# Patient Record
Sex: Female | Born: 1990 | Race: Black or African American | Hispanic: No | Marital: Single | Smoking: Former smoker
Health system: Southern US, Community
[De-identification: ages and names within clinical notes are randomized; demographics above are authoritative.]

---

## 2013-08-13 ENCOUNTER — Emergency Department (HOSPITAL_COMMUNITY)
Admission: EM | Admit: 2013-08-13 | Discharge: 2013-08-13 | Disposition: A | Discharge status: Home / Self Care | Payer: 59 | Source: Home / Self Care | Attending: Emergency Medicine | Admitting: Emergency Medicine

## 2013-08-13 ENCOUNTER — Encounter (HOSPITAL_COMMUNITY): Payer: Self-pay | Admitting: Emergency Medicine

## 2013-08-13 DIAGNOSIS — N949 Unspecified condition associated with female genital organs and menstrual cycle: Secondary | ICD-10-CM

## 2013-08-13 DIAGNOSIS — R102 Pelvic and perineal pain: Secondary | ICD-10-CM

## 2013-08-13 LAB — POCT PREGNANCY, URINE: PREG TEST UR: NEGATIVE

## 2013-08-13 LAB — POCT URINALYSIS DIP (DEVICE)
Bilirubin Urine: NEGATIVE
Glucose, UA: NEGATIVE mg/dL
Hgb urine dipstick: NEGATIVE
Ketones, ur: 15 mg/dL — AB
Leukocytes, UA: NEGATIVE
NITRITE: NEGATIVE
PROTEIN: NEGATIVE mg/dL
UROBILINOGEN UA: 0.2 mg/dL (ref 0.0–1.0)
pH: 6.5 (ref 5.0–8.0)

## 2013-08-13 NOTE — ED Provider Notes (Signed)
Medical screening examination/treatment/procedure(s) were performed by non-physician practitioner and as supervising physician I was immediately available for consultation/collaboration.  Vi Whitesel, M.D.   ReLeslee Homeuben Likesavid C Jadis Pitter, MD 08/13/13 [phone]

## 2013-08-13 NOTE — ED Notes (Signed)
Pt  Reports  Symptoms  Of  abd  Pain  With  Cramping          For  About 1  Week          She  denys  Any  Nausea       Vomiting  Or  Diarrhea        She     denys  Any  Vaginal bleeding  Or  Any  Discharge      -  She  Is  Sitting  Upright on exam table  Speaking in  Complete  sentances

## 2013-08-13 NOTE — ED Provider Notes (Signed)
CSN: [id number]     Arrival date & time 08/13/13  1444 History   First MD Initiated Contact with Patient 08/13/13 1659     Chief Complaint  Patient presents with  . Abdominal Cramping   (Consider location/radiation/quality/duration/timing/severity/associated sxs/prior Treatment) HPI Comments: Reports pelvic cramping with 1-2 days of vaginal spotting on first two days of symptoms and associated mild nausea. Was concerned that she might be pregnant. No dysuria, hematuria, flank pain, vaginal discharge, blood per rectum, melena, vomiting or diarrhea, fever. Reports mild to moderate constipation.  LNMP: 3/3-07/28/2013  Patient is a 23 y.o. female presenting with cramps. The history is provided by the patient.  Abdominal Cramping This is a new problem. Episode onset: 1 week ago. Episode frequency: occasional. The problem has been gradually improving. Nothing aggravates the symptoms. Nothing relieves the symptoms.    History reviewed. No pertinent past medical history. History reviewed. No pertinent past surgical history. History reviewed. No pertinent family history. History  Substance Use Topics  . Smoking status: Former Games developer  . Smokeless tobacco: Not on file  . Alcohol Use: No   OB History   Grav Para Term Preterm Abortions TAB SAB Ect Mult Living                 Review of Systems  All other systems reviewed and are negative.    Allergies  Review of patient's allergies indicates no known allergies.  Home Medications   Current Outpatient Rx  Name  Route  Sig  Dispense  Refill  . Ibuprofen (MOTRIN PO)   Oral   Take by mouth.          BP 139/92  Pulse 69  Temp(Src) 99.2 F (37.3 C) (Oral)  Resp 16  SpO2 100%  LMP 07/28/2013 Physical Exam  Nursing note and vitals reviewed. Constitutional: She is oriented to person, place, and time. She appears well-developed and well-nourished. No distress.  HENT:  Head: Normocephalic and atraumatic.  Right Ear: External ear  normal.  Left Ear: External ear normal.  Nose: Nose normal.  Eyes: Conjunctivae are normal. No scleral icterus.  Neck: Normal range of motion. Neck supple.  Cardiovascular: Normal rate, regular rhythm and normal heart sounds.   Pulmonary/Chest: Effort normal and breath sounds normal.  Abdominal: Soft. Normal appearance and bowel sounds are normal. She exhibits no distension and no mass. There is no hepatosplenomegaly. There is no tenderness. There is no rigidity, no rebound, no guarding, no CVA tenderness, no tenderness at McBurney's point and negative Murphy's sign. Hernia confirmed negative in the ventral area, confirmed negative in the right inguinal area and confirmed negative in the left inguinal area.  Musculoskeletal: Normal range of motion.  Neurological: She is alert and oriented to person, place, and time.  Skin: Skin is warm and dry. No rash noted. No erythema.  Psychiatric: She has a normal mood and affect. Her behavior is normal.    ED Course  Procedures (including critical care time) Labs Review Labs Reviewed  POCT URINALYSIS DIP (DEVICE) - Abnormal; Notable for the following:    Ketones, ur 15 (*)    All other components within normal limits  POCT PREGNANCY, URINE   Imaging Review No results found.   MDM   1. Pelvic cramping    Advised patient to monitor her symptoms closely and to retest for pregnancy at home if she misses her menstrual cycle that is due at the beginning of April. Advised that if symptoms become suddenly worse, persistent or severe,  she should seek re-evaluation at her nearest ER. Tylenol or ibuprofen as directed on packaging. Increase water intact as UA suggests mild dehydration and this will contribute to constipation.   Jess BartersJennifer Lee BelfieldPresson, GeorgiaPA 08/13/13 1736

## 2013-08-13 NOTE — Discharge Instructions (Signed)
monitor your symptoms closely at home and retest for pregnancy at home if you miss your menstrual cycle that is due at the beginning of April. Advised that if symptoms become suddenly worse, persistent or severe, seek re-evaluation at nearest ER. Tylenol or ibuprofen as directed on packaging. Increase water intact as UA suggests mild dehydration and this will contribute to constipation.  Pelvic Pain, Female Female pelvic pain can be caused by many different things and start from a variety of places. Pelvic pain refers to pain that is located in the lower half of the abdomen and between your hips. The pain may occur over a short period of time (acute) or may be reoccurring (chronic). The cause of pelvic pain may be related to disorders affecting the female reproductive organs (gynecologic), but it may also be related to the bladder, kidney stones, an intestinal complication, or muscle or skeletal problems. Getting help right away for pelvic pain is important, especially if there has been severe, sharp, or a sudden onset of unusual pain. It is also important to get help right away because some types of pelvic pain can be life threatening.  CAUSES  Below are only some of the causes of pelvic pain. The causes of pelvic pain can be in one of several categories.   Gynecologic.  Pelvic inflammatory disease.  Sexually transmitted infection.  Ovarian cyst or a twisted ovarian ligament (ovarian torsion).  Uterine lining that grows outside the uterus (endometriosis).  Fibroids, cysts, or tumors.  Ovulation.  Pregnancy.  Pregnancy that occurs outside the uterus (ectopic pregnancy).  Miscarriage.  Labor.  Abruption of the placenta or ruptured uterus.  Infection.  Uterine infection (endometritis).  Bladder infection.  Diverticulitis.  Miscarriage related to a uterine infection (septic abortion).  Bladder.  Inflammation of the bladder (cystitis).  Kidney  stone(s).  Gastrointenstinal.  Constipation.  Diverticulitis.  Neurologic.  Trauma.  Feeling pelvic pain because of mental or emotional causes (psychosomatic).  Cancers of the bowel or pelvis. EVALUATION  Your caregiver will want to take a careful history of your concerns. This includes recent changes in your health, a careful gynecologic history of your periods (menses), and a sexual history. Obtaining your family history and medical history is also important. Your caregiver may suggest a pelvic exam. A pelvic exam will help identify the location and severity of the pain. It also helps in the evaluation of which organ system may be involved. In order to identify the cause of the pelvic pain and be properly treated, your caregiver may order tests. These tests may include:   A pregnancy test.  Pelvic ultrasonography.  An X-ray exam of the abdomen.  A urinalysis or evaluation of vaginal discharge.  Blood tests. HOME CARE INSTRUCTIONS   Only take over-the-counter or prescription medicines for pain, discomfort, or fever as directed by your caregiver.   Rest as directed by your caregiver.   Eat a balanced diet.   Drink enough fluids to make your urine clear or pale yellow, or as directed.   Avoid sexual intercourse if it causes pain.   Apply warm or cold compresses to the lower abdomen depending on which one helps the pain.   Avoid stressful situations.   Keep a journal of your pelvic pain. Write down when it started, where the pain is located, and if there are things that seem to be associated with the pain, such as food or your menstrual cycle.  Follow up with your caregiver as directed.  SEEK MEDICAL CARE IF:  Your medicine does not help your pain.  You have abnormal vaginal discharge. SEEK IMMEDIATE MEDICAL CARE IF:   You have heavy bleeding from the vagina.   Your pelvic pain increases.   You feel lightheaded or faint.   You have chills.   You  have pain with urination or blood in your urine.   You have uncontrolled diarrhea or vomiting.   You have a fever or persistent symptoms for more than 3 days.  You have a fever and your symptoms suddenly get worse.   You are being physically or sexually abused.  MAKE SURE YOU:  Understand these instructions.  Will watch your condition.  Will get help if you are not doing well or get worse. Document Released: 04/05/2004 Document Revised: 11/08/2011 Document Reviewed: 08/29/2011 [facility] CenterExit[facility] Patient Information 2014 [place]ExitCare, MarylandLLC.

## 2013-08-13 NOTE — ED Notes (Signed)
Phone number verified for lab f/u.

## 2013-08-26 ENCOUNTER — Emergency Department (HOSPITAL_COMMUNITY)
Admission: EM | Admit: 2013-08-26 | Discharge: 2013-08-26 | Discharge status: Against Medical Advice | Payer: 59 | Source: Home / Self Care

## 2013-08-26 ENCOUNTER — Emergency Department (HOSPITAL_COMMUNITY)
Admission: EM | Admit: 2013-08-26 | Discharge: 2013-08-27 | Disposition: A | Discharge status: Home / Self Care | Payer: 59 | Attending: Emergency Medicine | Admitting: Emergency Medicine

## 2013-08-26 ENCOUNTER — Encounter (HOSPITAL_COMMUNITY): Payer: Self-pay | Admitting: Emergency Medicine

## 2013-08-26 DIAGNOSIS — R1013 Epigastric pain: Secondary | ICD-10-CM | POA: Insufficient documentation

## 2013-08-26 DIAGNOSIS — R0602 Shortness of breath: Secondary | ICD-10-CM | POA: Insufficient documentation

## 2013-08-26 DIAGNOSIS — Z87891 Personal history of nicotine dependence: Secondary | ICD-10-CM

## 2013-08-26 DIAGNOSIS — Z3202 Encounter for pregnancy test, result negative: Secondary | ICD-10-CM | POA: Insufficient documentation

## 2013-08-26 DIAGNOSIS — R109 Unspecified abdominal pain: Secondary | ICD-10-CM

## 2013-08-26 DIAGNOSIS — R1011 Right upper quadrant pain: Secondary | ICD-10-CM | POA: Insufficient documentation

## 2013-08-26 LAB — COMPREHENSIVE METABOLIC PANEL
ALK PHOS: 54 U/L (ref 39–117)
ALT: 12 U/L (ref 0–35)
AST: 14 U/L (ref 0–37)
Albumin: 3.9 g/dL (ref 3.5–5.2)
BUN: 11 mg/dL (ref 6–23)
CO2: 26 mEq/L (ref 19–32)
Calcium: 9.4 mg/dL (ref 8.4–10.5)
Chloride: 103 mEq/L (ref 96–112)
Creatinine, Ser: 0.72 mg/dL (ref 0.50–1.10)
GFR calc Af Amer: >90 mL/min (ref >90)
GLUCOSE: 88 mg/dL (ref 70–99)
Potassium: 3.6 mEq/L — ABNORMAL LOW (ref 3.7–5.3)
Sodium: 144 mEq/L (ref 137–147)
TOTAL PROTEIN: 7.5 g/dL (ref 6.0–8.3)
Total Bilirubin: 0.4 mg/dL (ref 0.3–1.2)

## 2013-08-26 LAB — CBC WITH DIFFERENTIAL/PLATELET
Basophils Absolute: 0 10*3/uL (ref 0.0–0.1)
Basophils Relative: 0 % (ref 0–1)
EOS ABS: 0 10*3/uL (ref 0.0–0.7)
Eosinophils Relative: 0 % (ref 0–5)
HEMATOCRIT: 38 % (ref 36.0–46.0)
Hemoglobin: 13 g/dL (ref 12.0–15.0)
LYMPHS ABS: 2.1 10*3/uL (ref 0.7–4.0)
Lymphocytes Relative: 16 % (ref 12–46)
MCH: 30.7 pg (ref 26.0–34.0)
MCHC: 34.2 g/dL (ref 30.0–36.0)
MCV: 89.8 fL (ref 78.0–100.0)
MONOS PCT: 11 % (ref 3–12)
Monocytes Absolute: 1.4 10*3/uL — ABNORMAL HIGH (ref 0.1–1.0)
NEUTROS ABS: 9.6 10*3/uL — AB (ref 1.7–7.7)
Neutrophils Relative %: 73 % (ref 43–77)
PLATELETS: 238 10*3/uL (ref 150–400)
RBC: 4.23 MIL/uL (ref 3.87–5.11)
RDW: 14 % (ref 11.5–15.5)
WBC: 13.2 10*3/uL — ABNORMAL HIGH (ref 4.0–10.5)

## 2013-08-26 LAB — URINALYSIS, ROUTINE W REFLEX MICROSCOPIC
Bilirubin Urine: NEGATIVE
Glucose, UA: NEGATIVE mg/dL
Hgb urine dipstick: NEGATIVE
KETONES UR: NEGATIVE mg/dL
NITRITE: NEGATIVE
PH: 6 (ref 5.0–8.0)
Protein, ur: NEGATIVE mg/dL
Specific Gravity, Urine: 1.029 (ref 1.005–1.030)
Urobilinogen, UA: 1 mg/dL (ref 0.0–1.0)

## 2013-08-26 LAB — URINE MICROSCOPIC-ADD ON

## 2013-08-26 LAB — LIPASE, BLOOD: LIPASE: 17 U/L (ref 11–59)

## 2013-08-26 LAB — PREGNANCY, URINE: Preg Test, Ur: NEGATIVE

## 2013-08-26 MED ORDER — SODIUM CHLORIDE 0.9 % IV SOLN
Freq: Once | INTRAVENOUS | Status: AC
  Administered 2013-08-26: 23:00:00 via INTRAVENOUS

## 2013-08-26 MED ORDER — FENTANYL CITRATE 0.05 MG/ML IJ SOLN: 100.0000 ug | Freq: Once | INTRAMUSCULAR | Status: DC

## 2013-08-26 MED ORDER — FENTANYL CITRATE 0.05 MG/ML IJ SOLN
50.0000 ug | Freq: Once | INTRAMUSCULAR | Status: AC
  Administered 2013-08-26: 50 ug via INTRAVENOUS
  Filled 2013-08-26: qty 2

## 2013-08-26 MED ORDER — GI COCKTAIL ~~LOC~~
30.0000 mL | Freq: Once | ORAL | Status: AC
  Administered 2013-08-26: 30 mL via ORAL
  Filled 2013-08-26: qty 30

## 2013-08-26 NOTE — ED Notes (Signed)
Patient called x2 for vital signs 

## 2013-08-26 NOTE — ED Provider Notes (Signed)
CSN: [id number]     Arrival date & time 08/26/13  1933 History   First MD Initiated Contact with Patient 08/26/13 2300     Chief Complaint  Patient presents with  . Abdominal Pain     (Consider location/radiation/quality/duration/timing/severity/associated sxs/prior Treatment) Patient is a 23 y.o. female presenting with abdominal pain. The history is provided by the patient. No language interpreter was used.  Abdominal Pain Pain location:  RUQ Pain quality: sharp   Pain radiates to:  Does not radiate Pain severity:  Moderate Associated symptoms: shortness of breath   Associated symptoms: no chills, no cough, no dysuria, no fever, no nausea, no vaginal bleeding, no vaginal discharge and no vomiting   Associated symptoms comment:  For the past several hours the patient has experienced RUQ abdominal pain that is sharp and increased with certain movements and with deep breath. She endorses SOB. No cough or fever recently. She denies nausea or vomiting and no change in her bowel movements. She denies dysuria, vaginal discharge or current menstrual difficulties. She reports that 2 weeks ago she was experiencing lower abdominal cramping more than her usual associated with menses.    History reviewed. No pertinent past medical history. History reviewed. No pertinent past surgical history. History reviewed. No pertinent family history. History  Substance Use Topics  . Smoking status: Former Games developermoker  . Smokeless tobacco: Not on file  . Alcohol Use: No   OB History   Grav Para Term Preterm Abortions TAB SAB Ect Mult Living                 Review of Systems  Constitutional: Negative for fever and chills.  Respiratory: Positive for shortness of breath. Negative for cough.   Cardiovascular: Negative.   Gastrointestinal: Positive for abdominal pain. Negative for nausea and vomiting.  Genitourinary: Negative.  Negative for dysuria, vaginal bleeding, vaginal discharge and pelvic pain.   Musculoskeletal: Negative.  Negative for back pain.  Skin: Negative.   Neurological: Negative.       Allergies  Review of patient's allergies indicates no known allergies.  Home Medications   Current Outpatient Rx  Name  Route  Sig  Dispense  Refill  . acetaminophen (TYLENOL) 500 MG tablet   Oral   Take 1,000 mg by mouth every 6 (six) hours as needed (pain).           BP 131/88  Pulse 83  Temp(Src) 100.4 F (38 C) (Oral)  Resp 16  SpO2 100%  LMP 07/22/2013 Physical Exam  Constitutional: She is oriented to person, place, and time. She appears well-developed and well-nourished.  HENT:  Head: Normocephalic.  Neck: Normal range of motion. Neck supple.  Cardiovascular: Normal rate and regular rhythm.   Pulmonary/Chest: Effort normal and breath sounds normal. She has no wheezes. She has no rales. She exhibits no tenderness.  Abdominal: Soft. Bowel sounds are normal. There is tenderness. There is no rebound and no guarding.  Epigastric and right upper quadrant tenderness. No mass. Soft abdomen. Normal BS.   Musculoskeletal: Normal range of motion.  Neurological: She is alert and oriented to person, place, and time.  Skin: Skin is warm and dry. No rash noted.  Psychiatric: She has a normal mood and affect.    ED Course  Procedures (including critical care time) Labs Review Labs Reviewed  URINALYSIS, ROUTINE W REFLEX MICROSCOPIC - Abnormal; Notable for the following:    APPearance CLOUDY (*)    Leukocytes, UA TRACE (*)    All  other components within normal limits  URINE MICROSCOPIC-ADD ON - Abnormal; Notable for the following:    Squamous Epithelial / LPF FEW (*)    Bacteria, UA FEW (*)    All other components within normal limits  PREGNANCY, URINE  LIPASE, BLOOD   Results for orders placed during the hospital encounter of 08/26/13  URINALYSIS, ROUTINE W REFLEX MICROSCOPIC      Result Value Ref Range   Color, Urine YELLOW  YELLOW   APPearance CLOUDY (*) CLEAR    Specific Gravity, Urine 1.029  1.005 - 1.030   pH 6.0  5.0 - 8.0   Glucose, UA NEGATIVE  NEGATIVE mg/dL   Hgb urine dipstick NEGATIVE  NEGATIVE   Bilirubin Urine NEGATIVE  NEGATIVE   Ketones, ur NEGATIVE  NEGATIVE mg/dL   Protein, ur NEGATIVE  NEGATIVE mg/dL   Urobilinogen, UA 1.0  0.0 - 1.0 mg/dL   Nitrite NEGATIVE  NEGATIVE   Leukocytes, UA TRACE (*) NEGATIVE  PREGNANCY, URINE      Result Value Ref Range   Preg Test, Ur NEGATIVE  NEGATIVE  URINE MICROSCOPIC-ADD ON      Result Value Ref Range   Squamous Epithelial / LPF FEW (*) RARE   WBC, UA 3-6  <3 WBC/hpf   Bacteria, UA FEW (*) RARE   Urine-Other MUCOUS PRESENT    LIPASE, BLOOD      Result Value Ref Range   Lipase 17  11 - 59 U/L  Dg Chest 2 View  08/27/2013   CLINICAL DATA:  Upper abdominal pain.  EXAM: CHEST  2 VIEW  COMPARISON:  None.  FINDINGS: Lungs are clear. Cardiomediastinal silhouette and remainder of the exam is within normal.  IMPRESSION: No active cardiopulmonary disease.   Electronically Signed   By: Elberta Fortis M.D.   On: 08/27/2013 00:20    Imaging Review No results found.   EKG Interpretation None      MDM   Final diagnoses:  None    1. Abdominal pain  Lab studies without significant abnormality in a patient with pain atypical for gall stones, PNA, or pancreatitis. No regular use NSAIDs and GI Cocktail without relief - doubt gastritis. Discussed abdominal ultrasound and patient declines study. She reports she prefers to obtain study through primary care physician and will contact in the morning.     Arnoldo Hooker, PA-C 08/27/13 [id number]

## 2013-08-26 NOTE — ED Notes (Signed)
Rt sided abd pain since Friday worse today  No n/v ,has been cramping last wed last day of menstrual period and it was heavier than normal

## 2013-08-26 NOTE — ED Notes (Signed)
Pt reports RU ab pain that started Friday. Pt states that pain starts at epigastric region and moves around to R side. Pt denies n/v/d. Pt states that she has SOB when she tries to take deep breaths and can feel the pain more to RU ab. Pt alert and ambulatory. Family in room.

## 2013-08-27 ENCOUNTER — Encounter: Payer: Self-pay | Admitting: Gastroenterology

## 2013-08-27 ENCOUNTER — Emergency Department (HOSPITAL_COMMUNITY): Payer: 59

## 2013-08-27 LAB — D-DIMER, QUANTITATIVE (NOT AT ARMC): D DIMER QUANT: 2.29 ug{FEU}/mL — AB (ref 0.00–0.48)

## 2013-08-27 MED ORDER — MORPHINE SULFATE 4 MG/ML IJ SOLN
4.0000 mg | Freq: Once | INTRAMUSCULAR | Status: AC
  Administered 2013-08-27: 4 mg via INTRAVENOUS
  Filled 2013-08-27: qty 1

## 2013-08-27 MED ORDER — HYDROCODONE-ACETAMINOPHEN 5-325 MG PO TABS: 1.0000 | ORAL_TABLET | ORAL | Status: DC | PRN

## 2013-08-27 NOTE — Discharge Instructions (Signed)
Abdominal Pain, Women °Abdominal (stomach, pelvic, or belly) pain can be caused by many things. It is important to tell your doctor: °· The location of the pain. °· Does it come and go or is it present all the time? °· Are there things that start the pain (eating certain foods, exercise)? °· Are there other symptoms associated with the pain (fever, nausea, vomiting, diarrhea)? °All of this is helpful to know when trying to find the cause of the pain. °CAUSES  °· Stomach: virus or bacteria infection, or ulcer. °· Intestine: appendicitis (inflamed appendix), regional ileitis (Crohn's disease), ulcerative colitis (inflamed colon), irritable bowel syndrome, diverticulitis (inflamed diverticulum of the colon), or cancer of the stomach or intestine. °· Gallbladder disease or stones in the gallbladder. °· Kidney disease, kidney stones, or infection. °· Pancreas infection or cancer. °· Fibromyalgia (pain disorder). °· Diseases of the female organs: °· Uterus: fibroid (non-cancerous) tumors or infection. °· Fallopian tubes: infection or tubal pregnancy. °· Ovary: cysts or tumors. °· Pelvic adhesions (scar tissue). °· Endometriosis (uterus lining tissue growing in the pelvis and on the pelvic organs). °· Pelvic congestion syndrome (female organs filling up with blood just before the menstrual period). °· Pain with the menstrual period. °· Pain with ovulation (producing an egg). °· Pain with an IUD (intrauterine device, birth control) in the uterus. °· Cancer of the female organs. °· Functional pain (pain not caused by a disease, may improve without treatment). °· Psychological pain. °· Depression. °DIAGNOSIS  °Your doctor will decide the seriousness of your pain by doing an examination. °· Blood tests. °· X-rays. °· Ultrasound. °· CT scan (computed tomography, special type of X-ray). °· MRI (magnetic resonance imaging). °· Cultures, for infection. °· Barium enema (dye inserted in the large intestine, to better view it with  X-rays). °· Colonoscopy (looking in intestine with a lighted tube). °· Laparoscopy (minor surgery, looking in abdomen with a lighted tube). °· Major abdominal exploratory surgery (looking in abdomen with a large incision). °TREATMENT  °The treatment will depend on the cause of the pain.  °· Many cases can be observed and treated at home. °· Over-the-counter medicines recommended by your caregiver. °· Prescription medicine. °· Antibiotics, for infection. °· Birth control pills, for painful periods or for ovulation pain. °· Hormone treatment, for endometriosis. °· Nerve blocking injections. °· Physical therapy. °· Antidepressants. °· Counseling with a psychologist or psychiatrist. °· Minor or major surgery. °HOME CARE INSTRUCTIONS  °· Do not take laxatives, unless directed by your caregiver. °· Take over-the-counter pain medicine only if ordered by your caregiver. Do not take aspirin because it can cause an upset stomach or bleeding. °· Try a clear liquid diet (broth or water) as ordered by your caregiver. Slowly move to a bland diet, as tolerated, if the pain is related to the stomach or intestine. °· Have a thermometer and take your temperature several times a day, and record it. °· Bed rest and sleep, if it helps the pain. °· Avoid sexual intercourse, if it causes pain. °· Avoid stressful situations. °· Keep your follow-up appointments and tests, as your caregiver orders. °· If the pain does not go away with medicine or surgery, you may try: °· Acupuncture. °· Relaxation exercises (yoga, meditation). °· Group therapy. °· Counseling. °SEEK MEDICAL CARE IF:  °· You notice certain foods cause stomach pain. °· Your home care treatment is not helping your pain. °· You need stronger pain medicine. °· You want your IUD removed. °· You feel faint or   lightheaded. °· You develop nausea and vomiting. °· You develop a rash. °· You are having side effects or an allergy to your medicine. °SEEK IMMEDIATE MEDICAL CARE IF:  °· Your  pain does not go away or gets worse. °· You have a fever. °· Your pain is felt only in portions of the abdomen. The right side could possibly be appendicitis. The left lower portion of the abdomen could be colitis or diverticulitis. °· You are passing blood in your stools (bright red or black tarry stools, with or without vomiting). °· You have blood in your urine. °· You develop chills, with or without a fever. °· You pass out. °MAKE SURE YOU:  °· Understand these instructions. °· Will watch your condition. °· Will get help right away if you are not doing well or get worse. °Document Released: 03/06/2007 Document Revised: 08/01/2011 Document Reviewed: 03/26/2009 °ExitCare® Patient Information ©2014 [employer]. ° °

## 2013-08-28 NOTE — ED Provider Notes (Signed)
Medical screening examination/treatment/procedure(s) were performed by non-physician practitioner and as supervising physician I was immediately available for consultation/collaboration.   EKG Interpretation None        William Trayson Stitely, MD 08/28/13 0000 

## 2013-09-25 ENCOUNTER — Ambulatory Visit: Payer: 59 | Admitting: Gastroenterology

## 2014-06-11 ENCOUNTER — Ambulatory Visit (INDEPENDENT_AMBULATORY_CARE_PROVIDER_SITE_OTHER): Payer: 59 | Admitting: Family Medicine

## 2014-06-11 VITALS — BP 126/82 | HR 67 | Temp 98.6°F | Resp 16 | Ht 65.0 in | Wt 142.6 lb

## 2014-06-11 DIAGNOSIS — H109 Unspecified conjunctivitis: Secondary | ICD-10-CM

## 2014-06-11 MED ORDER — TOBRAMYCIN 0.3 % OP SOLN: 1.0000 [drp] | OPHTHALMIC | Status: DC

## 2014-06-11 NOTE — Patient Instructions (Signed)

## 2014-06-11 NOTE — Progress Notes (Signed)
Chief Complaint:  Chief Complaint  Patient presents with  . possible pink eye    started on sunday (x3 days ago)  . Edema    left eye swelling and was crusted over    HPI: 24 y.o. year old female presents with a 3 day history of eye redness involving l eye.  no contact use  No sick contacts, recent antibiotics, or recent travels.   No leg trauma, sedentary periods, h/o cancer, or tobacco use.  History reviewed. No pertinent past medical history.   Home Meds: Prior to Admission medications   Medication Sig Start Date End Date Taking? Authorizing Provider  medroxyPROGESTERone (DEPO-PROVERA) 150 MG/ML injection Inject 150 mg into the muscle every 3 (three) months.   Yes Historical Provider, MD  tobramycin (TOBREX) 0.3 % ophthalmic solution Place 1 drop into the left eye every 4 (four) hours. 06/11/14   Elvina SidleKurt Cayde Held, MD    Allergies: No Known Allergies  History   Social History  . Marital Status: Single    Spouse Name: N/A    Number of Children: N/A  . Years of Education: N/A   Occupational History  . Not on file.   Social History Main Topics  . Smoking status: Former Games developermoker  . Smokeless tobacco: Not on file  . Alcohol Use: No  . Drug Use: Not on file  . Sexual Activity: Not on file   Other Topics Concern  . Not on file   Social History Narrative     Review of Systems: Constitutional: negative for chills, fever, night sweats or weight changes Cardiovascular: negative for chest pain or palpitations Respiratory: negative for hemoptysis, wheezing, or shortness of breath Abdominal: negative for abdominal pain, nausea, vomiting or diarrhea Dermatological: negative for rash Neurologic: negative for headache   Physical Exam: Blood pressure 126/82, pulse 67, temperature 98.6 F (37 C), temperature source Oral, resp. rate 16, height 5\' 5"  (1.651 m), weight 142 lb 9.6 oz (64.683 kg), last menstrual period 06/11/2014, SpO2 99 %., Body mass index is 23.73  kg/(m^2). General: Well developed, well nourished, in no acute distress. Head: Normocephalic, atraumatic, nares are congested. Bilateral auditory canals clear, TM's are without perforation, pearly grey with reflective cone of light bilaterally. No sinus TTP. Oral cavity moist, dentition normal. Posterior pharynx with post nasal drip and mild erythema. No peritonsillar abscess or tonsillar exudate. Eyes:  Injection left eye, mild discharge at lid margins Neck: Supple. No thyromegaly. Full ROM. No lymphadenopathy. Lungs: Coarse breath sounds bilaterally without wheezes, rales, or rhonchi. Breathing is unlabored.  Heart: RRR with S1 S2. No murmurs, rubs, or gallops appreciated. Msk:  Strength and tone normal for age. Extremities: No clubbing or cyanosis. No edema. Neuro: Alert and oriented X 3. Moves all extremities spontaneously. CNII-XII grossly in tact. Psych:  Responds to questions appropriately with a normal affect.   Labs:   ASSESSMENT AND PLAN:  24 y.o. year old female with conjunctivitis involving left eye. -   ICD-9-CM ICD-10-CM   1. Conjunctivitis of left eye 372.30 H10.9 tobramycin (TOBREX) 0.3 % ophthalmic solution   Pt ed given including hygiene measures and avoiding use of contact lenses -RTC precautions -RTC 3-5 days if no improvement  Signed, Elvina SidleKurt Bao Coreas, MD 06/11/2014 1:07 PM

## 2014-06-12 ENCOUNTER — Telehealth: Payer: Self-pay | Admitting: *Deleted

## 2014-06-12 ENCOUNTER — Encounter: Payer: Self-pay | Admitting: *Deleted

## 2014-06-12 NOTE — Telephone Encounter (Signed)
Pt needs a note for work.  Note provided

## 2014-06-16 ENCOUNTER — Ambulatory Visit (INDEPENDENT_AMBULATORY_CARE_PROVIDER_SITE_OTHER): Payer: 59 | Admitting: Family Medicine

## 2014-06-16 ENCOUNTER — Encounter: Payer: Self-pay | Admitting: Physician Assistant

## 2014-06-16 VITALS — BP 127/73 | HR 74 | Temp 99.1°F | Resp 16 | Ht 64.5 in | Wt 142.0 lb

## 2014-06-16 DIAGNOSIS — H5712 Ocular pain, left eye: Secondary | ICD-10-CM

## 2014-06-16 MED ORDER — CIPROFLOXACIN HCL 0.3 % OP SOLN: 1.0000 [drp] | OPHTHALMIC | Status: AC

## 2014-06-16 MED ORDER — VALACYCLOVIR HCL 1 G PO TABS: 1000.0000 mg | ORAL_TABLET | Freq: Three times a day (TID) | ORAL | Status: AC

## 2014-06-16 NOTE — Progress Notes (Signed)
Patient discussed with Mr. Agustin CreeMcVeigh. Agree with assessment and plan of care per his note.  Eye exam by me as well. Anterior chamber clear,  Proparacaine 2 gtts applied,with slight less soreness after gtts. Lids everted, no foreign body identified. Fluorescein applied, central diffuse uptake in patch with small linear component at about 7 o'clock. No true dendrites seen. Flushed with sterile water, no complications.

## 2014-06-16 NOTE — Progress Notes (Signed)
Subjective:    Patient ID: Debbie Kline, female    DOB: [birth year], 24 y.o.   MRN: [id number]  PCP: Altamese CarolinaMARTIN,TANYA D, MD  Chief Complaint  Patient presents with  . Pink Eye    has been on med for 5 days not any better   Prior to Admission medications   Medication Sig Start Date End Date Taking? Authorizing Provider  medroxyPROGESTERone (DEPO-PROVERA) 150 MG/ML injection Inject 150 mg into the muscle every 3 (three) months.   Yes Historical Provider, MD  ciprofloxacin (CILOXAN) 0.3 % ophthalmic solution Place 1 drop into both eyes every 2 (two) hours while awake. Administer 1 drop, every 2 hours, while awake, for 2 days. Then 1 drop, every 4 hours, while awake, for the next 5 days. 06/16/14   Raelyn Ensignodd Kamilya Wakeman, PA  valACYclovir (VALTREX) 1000 MG tablet Take 1 tablet (1,000 mg total) by mouth 3 (three) times daily. 06/16/14   Raelyn Ensignodd Johnwilliam Shepperson, PA   Medications, allergies, past medical history, surgical history, family history, social history and problem list reviewed and updated.  HPI  [age] yof with no significant pmh presents returns today for f/u eye pain.  Was seen at [facility] 5 days ago with red left eye with clear drainage and crusting in the am. Was having slight blurry vision she thought was due to tearing over the eye. Was started on tobramycin drops.  She returns today stating the drops have not helped at all. Has been using them as prescribed. Her eye continues to be red. She continues to have swelling around the left eye. The redness extended over to the right eye 2 days ago. Has had assoc photophobia and continued blurry vision when tearing. Has had mild crusting left eye every morning but no purulence.   She is sexually active with female partner. She has had gc and ct in the past. She denies her and her partner have any current std sx. Denies any semen exposure to eye. Denies any herpes outbreaks. Denies any itchy eyes, runny nose, hx seasonal allergies.   Review of Systems No fever,  chills.     Objective:   Physical Exam  Constitutional: She is oriented to person, place, and time. She appears well-developed and well-nourished.  Non-toxic appearance. She does not have a sickly appearance. She does not appear ill. No distress.  BP 127/73 mmHg  Pulse 74  Temp(Src) 99.1 F (37.3 C) (Oral)  Resp 16  Ht 5' 4.5" (1.638 m)  Wt 142 lb (64.411 kg)  BMI 24.01 kg/m2  SpO2 99%  LMP 06/11/2014   Eyes: Pupils are equal, round, and reactive to light. Lids are everted and swept, no foreign bodies found. Right conjunctiva is injected. Left conjunctiva is injected. Right eye exhibits normal extraocular motion and no nystagmus. Left eye exhibits normal extraocular motion and no nystagmus.  Slit lamp exam:      The right eye shows no anterior chamber bulge.       The left eye shows fluorescein uptake. The left eye shows no anterior chamber bulge.  Vision testing 20/20 right, 20/25 left. No painful EOM.  Diffuse central uptake with rectangular patch just medial to left pupil and small linear area uptake at 7:00 from pupil. No specific dendrites otherwise.    Neurological: She is alert and oriented to person, place, and time.      Assessment & Plan:   [age] yof with no significant pmh presents returns today for f/u eye pain.  Left eye pain - Plan:  ciprofloxacin (CILOXAN) 0.3 % ophthalmic solution, valACYclovir (VALTREX) 1000 MG tablet --referral to optho as pt is not better after 5 days abx, has photophobia and blurry vision, has increased uptake on fluorescein exam --> r/o keratitis --change abx drops to cipro --valtrex --she is seeing Dr Dione Booze today upon leaving clinic, they will adjust meds as they see fit  Donnajean Lopes, PA-C Physician Assistant-Certified [facility]  06/16/2014 5:12 PM

## 2014-06-16 NOTE — Patient Instructions (Signed)
[address] They are aware you're coming now. I've given you two prescriptions to fill. Please go to the eye doctor first before you fill these in case they change them.

## 2015-01-11 IMAGING — CR DG CHEST 2V
2 series · 2 of 2 positions shown · non-contrast
Comparison: None.

CLINICAL DATA: Upper abdominal pain.

EXAM:
CHEST  2 VIEW

[w chest pa]
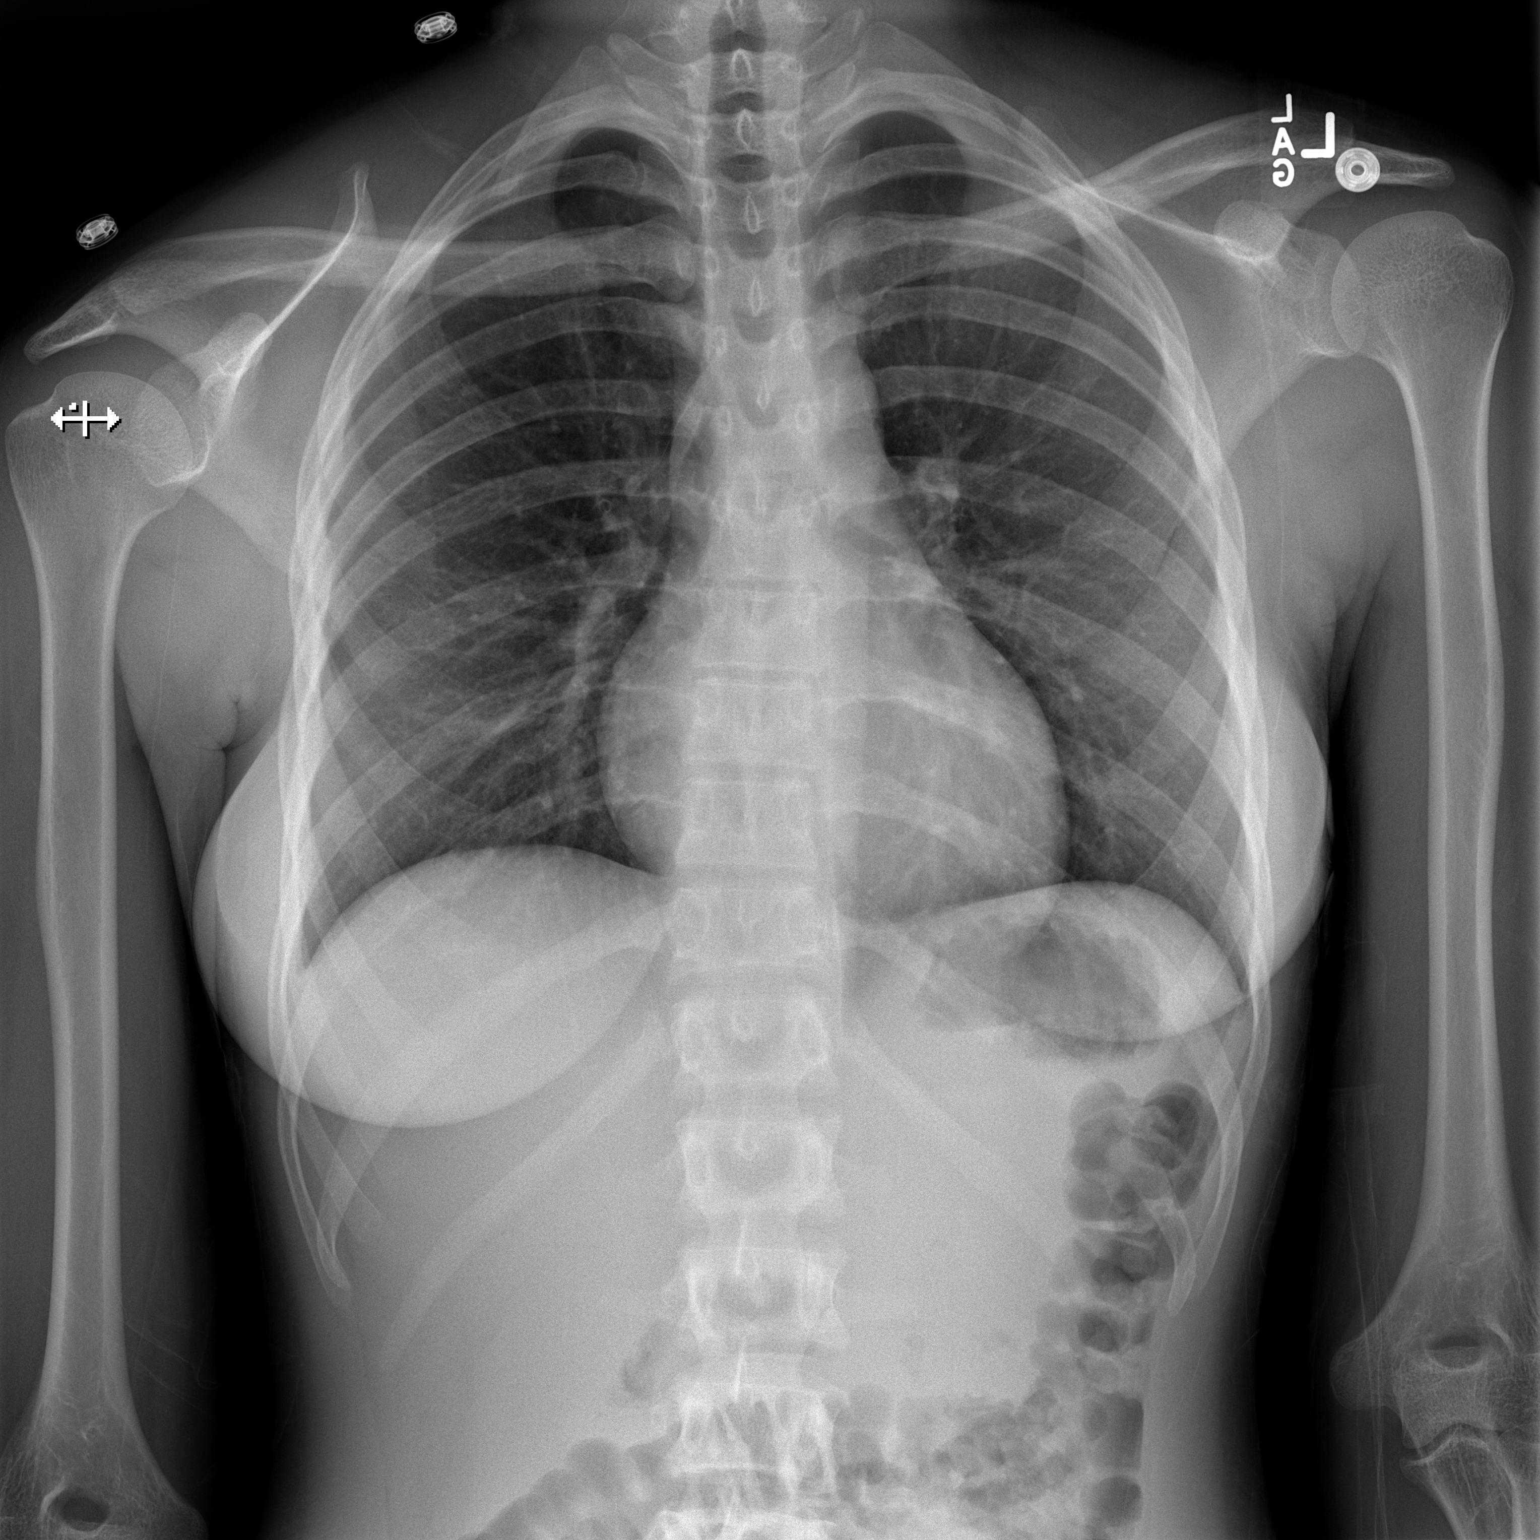

[w chest lat]
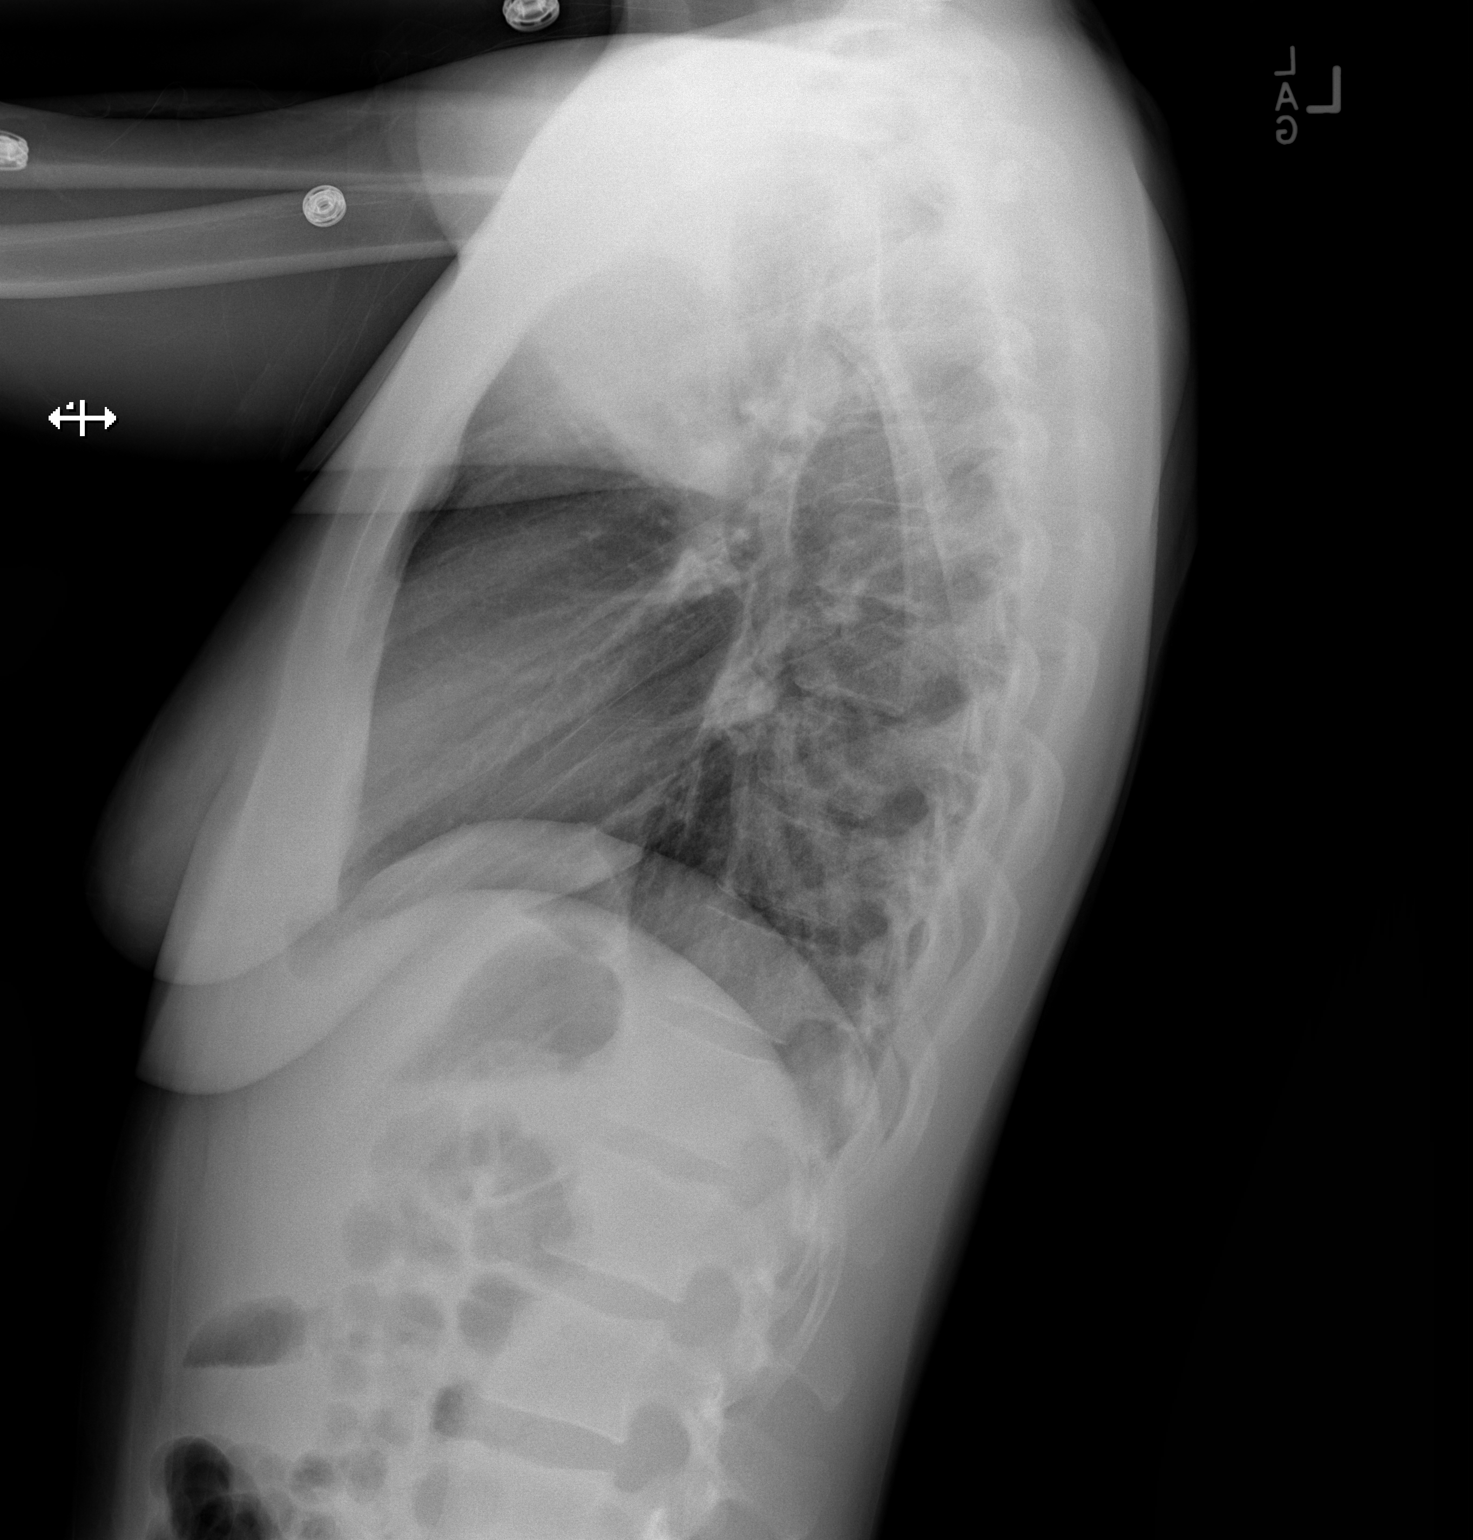

[2 of 2 positions shown; findings below may reference images not displayed]

FINDINGS: Lungs are clear. Cardiomediastinal silhouette and remainder of the
exam is within normal.
IMPRESSION: No active cardiopulmonary disease.

## 2016-11-01 ENCOUNTER — Ambulatory Visit (INDEPENDENT_AMBULATORY_CARE_PROVIDER_SITE_OTHER): Payer: BLUE CROSS/BLUE SHIELD | Admitting: Orthopaedic Surgery

## 2016-11-01 ENCOUNTER — Ambulatory Visit (INDEPENDENT_AMBULATORY_CARE_PROVIDER_SITE_OTHER): Payer: Self-pay

## 2016-11-01 ENCOUNTER — Encounter (INDEPENDENT_AMBULATORY_CARE_PROVIDER_SITE_OTHER): Payer: Self-pay | Admitting: Orthopaedic Surgery

## 2016-11-01 DIAGNOSIS — M25561 Pain in right knee: Secondary | ICD-10-CM

## 2016-11-01 MED ORDER — DICLOFENAC SODIUM 1 % TD GEL: 2.0000 g | Freq: Four times a day (QID) | TRANSDERMAL | 5 refills | Status: AC

## 2016-11-01 NOTE — Progress Notes (Signed)
   Office Visit Note   Patient: Debbie Kline           Date of Birth: June 07, 1990           MRN: [id number] Visit Date: 11/01/2016              Requested by: Alwyn PeaMartin, Tanya, MD [phone] [place] [id number] PCP: Alwyn PeaMartin, Tanya, MD   Assessment & Plan: Visit Diagnoses:  1. Acute pain of right knee     Plan: Patient likely has patellar tendinitis and fat pad inflammation. I recommend rest, Voltaren gel, ice, knee brace. Follow-up as needed. If not better we'll consider physical therapy.  Follow-Up Instructions: Return if symptoms worsen or fail to improve.   Orders:  Orders Placed This Encounter  Procedures  . XR KNEE 3 VIEW RIGHT   Meds ordered this encounter  Medications  . diclofenac sodium (VOLTAREN) 1 % GEL    Sig: Apply 2 g topically 4 (four) times daily.    Dispense:  1 Tube    Refill:  5      Procedures: No procedures performed   Clinical Data: No additional findings.   Subjective: Chief Complaint  Patient presents with  . 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 She endorses 3 out of 10 pain with initial swelling that has improved. She is taking Aleve and wearing a knee brace and also icing this. She denies any mechanical symptoms. Denies any injuries.    Review of Systems  Constitutional: Negative.   HENT: Negative.   Eyes: Negative.   Respiratory: Negative.   Cardiovascular: Negative.   Endocrine: Negative.   Musculoskeletal: Negative.   Neurological: Negative.   Hematological: Negative.   Psychiatric/Behavioral: Negative.   All other systems reviewed and are negative.    Objective: Vital Signs: There were no vitals taken for this visit.  Physical Exam  Constitutional: She is oriented to person, place, and time. She appears well-developed and well-nourished.  HENT:    Head: Normocephalic and atraumatic.  Eyes: EOM are normal.  Neck: Neck supple.  Pulmonary/Chest: Effort normal.  Abdominal: Soft.  Neurological: She is alert and oriented to person, place, and time.  Skin: Skin is warm. Capillary refill takes less than 2 seconds.  Psychiatric: She has a normal mood and affect. Her behavior is normal. Judgment and thought content normal.  Nursing note and vitals reviewed.   Ortho Exam Right knee exam shows no joint effusion. No joint line tenderness. Collaterals and cruciates are stable. No significant patellar crepitus. Specialty Comments:  No specialty comments available.  Imaging: Xr Knee 3 View Right  Result Date: 11/01/2016 No acute structural abnormalities    PMFS History: There are no active problems to display for this patient.  No past medical history on file.  No family history on file.  No past surgical history on file. Social History   Occupational History  . Not on file.   Social History Main Topics  . Smoking status: Former Smoker    Quit date: 11/01/2016  . Smokeless tobacco: Never Used     Comment: PATIENT STATES SHE DOES NOT SMOKE.  Marland Kitchen. Alcohol use No  . Drug use: No  . Sexual activity: Yes    Birth control/ protection: Condom

## 2016-11-02 ENCOUNTER — Ambulatory Visit (INDEPENDENT_AMBULATORY_CARE_PROVIDER_SITE_OTHER): Payer: 59 | Admitting: Orthopaedic Surgery

## 2021-08-06 DIAGNOSIS — Z8719 Personal history of other diseases of the digestive system: Secondary | ICD-10-CM | POA: Diagnosis not present

## 2021-08-06 DIAGNOSIS — E669 Obesity, unspecified: Secondary | ICD-10-CM | POA: Diagnosis not present

## 2021-08-06 DIAGNOSIS — Z7689 Persons encountering health services in other specified circumstances: Secondary | ICD-10-CM | POA: Diagnosis not present

## 2021-08-06 DIAGNOSIS — R109 Unspecified abdominal pain: Secondary | ICD-10-CM | POA: Diagnosis not present

## 2021-08-25 DIAGNOSIS — R5383 Other fatigue: Secondary | ICD-10-CM | POA: Diagnosis not present

## 2021-08-25 DIAGNOSIS — Z833 Family history of diabetes mellitus: Secondary | ICD-10-CM | POA: Diagnosis not present

## 2021-08-25 DIAGNOSIS — R109 Unspecified abdominal pain: Secondary | ICD-10-CM | POA: Diagnosis not present

## 2021-08-30 DIAGNOSIS — R519 Headache, unspecified: Secondary | ICD-10-CM | POA: Diagnosis not present

## 2021-08-30 DIAGNOSIS — R109 Unspecified abdominal pain: Secondary | ICD-10-CM | POA: Diagnosis not present

## 2021-08-30 DIAGNOSIS — R7303 Prediabetes: Secondary | ICD-10-CM | POA: Diagnosis not present

## 2021-08-30 DIAGNOSIS — E669 Obesity, unspecified: Secondary | ICD-10-CM | POA: Diagnosis not present

## 2021-09-07 DIAGNOSIS — R911 Solitary pulmonary nodule: Secondary | ICD-10-CM | POA: Diagnosis not present

## 2021-09-07 DIAGNOSIS — R109 Unspecified abdominal pain: Secondary | ICD-10-CM | POA: Diagnosis not present

## 2021-11-12 DIAGNOSIS — R109 Unspecified abdominal pain: Secondary | ICD-10-CM | POA: Diagnosis not present

## 2021-11-12 DIAGNOSIS — R911 Solitary pulmonary nodule: Secondary | ICD-10-CM | POA: Diagnosis not present

## 2021-11-12 DIAGNOSIS — Z87891 Personal history of nicotine dependence: Secondary | ICD-10-CM | POA: Diagnosis not present

## 2022-02-01 DIAGNOSIS — R7303 Prediabetes: Secondary | ICD-10-CM | POA: Diagnosis not present

## 2022-02-01 DIAGNOSIS — Z0000 Encounter for general adult medical examination without abnormal findings: Secondary | ICD-10-CM | POA: Diagnosis not present

## 2022-02-01 DIAGNOSIS — Z114 Encounter for screening for human immunodeficiency virus [HIV]: Secondary | ICD-10-CM | POA: Diagnosis not present

## 2022-02-07 DIAGNOSIS — Z1151 Encounter for screening for human papillomavirus (HPV): Secondary | ICD-10-CM | POA: Diagnosis not present

## 2022-02-07 DIAGNOSIS — Z0000 Encounter for general adult medical examination without abnormal findings: Secondary | ICD-10-CM | POA: Diagnosis not present

## 2022-02-07 DIAGNOSIS — Z01419 Encounter for gynecological examination (general) (routine) without abnormal findings: Secondary | ICD-10-CM | POA: Diagnosis not present

## 2022-02-07 DIAGNOSIS — Z23 Encounter for immunization: Secondary | ICD-10-CM | POA: Diagnosis not present

## 2022-02-07 DIAGNOSIS — Z113 Encounter for screening for infections with a predominantly sexual mode of transmission: Secondary | ICD-10-CM | POA: Diagnosis not present

## 2022-02-09 DIAGNOSIS — R030 Elevated blood-pressure reading, without diagnosis of hypertension: Secondary | ICD-10-CM | POA: Diagnosis not present

## 2022-02-09 DIAGNOSIS — Z87898 Personal history of other specified conditions: Secondary | ICD-10-CM | POA: Diagnosis not present

## 2022-02-09 DIAGNOSIS — E669 Obesity, unspecified: Secondary | ICD-10-CM | POA: Diagnosis not present

## 2022-03-14 DIAGNOSIS — R7303 Prediabetes: Secondary | ICD-10-CM | POA: Diagnosis not present

## 2022-03-14 DIAGNOSIS — E669 Obesity, unspecified: Secondary | ICD-10-CM | POA: Diagnosis not present

## 2022-03-14 DIAGNOSIS — I10 Essential (primary) hypertension: Secondary | ICD-10-CM | POA: Diagnosis not present

## 2022-03-14 DIAGNOSIS — Z23 Encounter for immunization: Secondary | ICD-10-CM | POA: Diagnosis not present

## 2022-03-14 DIAGNOSIS — R030 Elevated blood-pressure reading, without diagnosis of hypertension: Secondary | ICD-10-CM | POA: Diagnosis not present

## 2022-08-10 DIAGNOSIS — Z23 Encounter for immunization: Secondary | ICD-10-CM | POA: Diagnosis not present

## 2022-09-06 DIAGNOSIS — E559 Vitamin D deficiency, unspecified: Secondary | ICD-10-CM | POA: Diagnosis not present

## 2022-09-06 DIAGNOSIS — E1165 Type 2 diabetes mellitus with hyperglycemia: Secondary | ICD-10-CM | POA: Diagnosis not present

## 2022-09-21 DIAGNOSIS — R7303 Prediabetes: Secondary | ICD-10-CM | POA: Diagnosis not present

## 2022-09-21 DIAGNOSIS — E559 Vitamin D deficiency, unspecified: Secondary | ICD-10-CM | POA: Diagnosis not present

## 2023-02-08 DIAGNOSIS — H6123 Impacted cerumen, bilateral: Secondary | ICD-10-CM | POA: Diagnosis not present

## 2023-02-09 DIAGNOSIS — Z87891 Personal history of nicotine dependence: Secondary | ICD-10-CM | POA: Diagnosis not present

## 2023-02-09 DIAGNOSIS — Z13220 Encounter for screening for lipoid disorders: Secondary | ICD-10-CM | POA: Diagnosis not present

## 2023-02-09 DIAGNOSIS — Z0000 Encounter for general adult medical examination without abnormal findings: Secondary | ICD-10-CM | POA: Diagnosis not present

## 2023-02-09 DIAGNOSIS — Z114 Encounter for screening for human immunodeficiency virus [HIV]: Secondary | ICD-10-CM | POA: Diagnosis not present

## 2023-03-16 DIAGNOSIS — N6459 Other signs and symptoms in breast: Secondary | ICD-10-CM | POA: Diagnosis not present

## 2023-03-16 DIAGNOSIS — Z803 Family history of malignant neoplasm of breast: Secondary | ICD-10-CM | POA: Diagnosis not present

## 2023-05-12 DIAGNOSIS — N760 Acute vaginitis: Secondary | ICD-10-CM | POA: Diagnosis not present

## 2023-05-12 DIAGNOSIS — Z1231 Encounter for screening mammogram for malignant neoplasm of breast: Secondary | ICD-10-CM | POA: Diagnosis not present

## 2023-05-12 DIAGNOSIS — Z8742 Personal history of other diseases of the female genital tract: Secondary | ICD-10-CM | POA: Diagnosis not present

## 2023-05-12 DIAGNOSIS — Z01419 Encounter for gynecological examination (general) (routine) without abnormal findings: Secondary | ICD-10-CM | POA: Diagnosis not present

## 2023-05-12 DIAGNOSIS — R102 Pelvic and perineal pain: Secondary | ICD-10-CM | POA: Diagnosis not present

## 2023-05-12 DIAGNOSIS — Z113 Encounter for screening for infections with a predominantly sexual mode of transmission: Secondary | ICD-10-CM | POA: Diagnosis not present

## 2023-05-12 DIAGNOSIS — N898 Other specified noninflammatory disorders of vagina: Secondary | ICD-10-CM | POA: Diagnosis not present

## 2023-05-12 DIAGNOSIS — Z758 Other problems related to medical facilities and other health care: Secondary | ICD-10-CM | POA: Diagnosis not present

## 2023-06-02 ENCOUNTER — Ambulatory Visit: Payer: No Typology Code available for payment source | Admitting: Family Medicine

## 2023-06-09 DIAGNOSIS — H6121 Impacted cerumen, right ear: Secondary | ICD-10-CM | POA: Diagnosis not present

## 2023-06-20 DIAGNOSIS — M5431 Sciatica, right side: Secondary | ICD-10-CM | POA: Diagnosis not present

## 2023-06-21 DIAGNOSIS — N979 Female infertility, unspecified: Secondary | ICD-10-CM | POA: Diagnosis not present

## 2023-07-18 DIAGNOSIS — Z0000 Encounter for general adult medical examination without abnormal findings: Secondary | ICD-10-CM | POA: Diagnosis not present

## 2023-07-24 DIAGNOSIS — Z319 Encounter for procreative management, unspecified: Secondary | ICD-10-CM | POA: Diagnosis not present

## 2023-07-25 DIAGNOSIS — Z0000 Encounter for general adult medical examination without abnormal findings: Secondary | ICD-10-CM | POA: Diagnosis not present

## 2023-07-25 DIAGNOSIS — M5431 Sciatica, right side: Secondary | ICD-10-CM | POA: Diagnosis not present

## 2023-08-15 DIAGNOSIS — M6281 Muscle weakness (generalized): Secondary | ICD-10-CM | POA: Diagnosis not present

## 2023-08-15 DIAGNOSIS — M5459 Other low back pain: Secondary | ICD-10-CM | POA: Diagnosis not present

## 2023-08-15 DIAGNOSIS — R293 Abnormal posture: Secondary | ICD-10-CM | POA: Diagnosis not present

## 2023-08-16 DIAGNOSIS — R293 Abnormal posture: Secondary | ICD-10-CM | POA: Diagnosis not present

## 2023-08-16 DIAGNOSIS — M6281 Muscle weakness (generalized): Secondary | ICD-10-CM | POA: Diagnosis not present

## 2023-08-16 DIAGNOSIS — M5459 Other low back pain: Secondary | ICD-10-CM | POA: Diagnosis not present

## 2023-08-21 DIAGNOSIS — M5459 Other low back pain: Secondary | ICD-10-CM | POA: Diagnosis not present

## 2023-08-21 DIAGNOSIS — R293 Abnormal posture: Secondary | ICD-10-CM | POA: Diagnosis not present

## 2023-08-21 DIAGNOSIS — M6281 Muscle weakness (generalized): Secondary | ICD-10-CM | POA: Diagnosis not present

## 2023-08-23 DIAGNOSIS — R293 Abnormal posture: Secondary | ICD-10-CM | POA: Diagnosis not present

## 2023-08-23 DIAGNOSIS — M5459 Other low back pain: Secondary | ICD-10-CM | POA: Diagnosis not present

## 2023-08-23 DIAGNOSIS — M6281 Muscle weakness (generalized): Secondary | ICD-10-CM | POA: Diagnosis not present

## 2023-08-28 DIAGNOSIS — R293 Abnormal posture: Secondary | ICD-10-CM | POA: Diagnosis not present

## 2023-08-28 DIAGNOSIS — M5459 Other low back pain: Secondary | ICD-10-CM | POA: Diagnosis not present

## 2023-08-28 DIAGNOSIS — M6281 Muscle weakness (generalized): Secondary | ICD-10-CM | POA: Diagnosis not present

## 2023-08-30 DIAGNOSIS — M6281 Muscle weakness (generalized): Secondary | ICD-10-CM | POA: Diagnosis not present

## 2023-08-30 DIAGNOSIS — R293 Abnormal posture: Secondary | ICD-10-CM | POA: Diagnosis not present

## 2023-08-30 DIAGNOSIS — M5459 Other low back pain: Secondary | ICD-10-CM | POA: Diagnosis not present

## 2023-09-06 DIAGNOSIS — M5459 Other low back pain: Secondary | ICD-10-CM | POA: Diagnosis not present

## 2023-09-06 DIAGNOSIS — R293 Abnormal posture: Secondary | ICD-10-CM | POA: Diagnosis not present

## 2023-09-06 DIAGNOSIS — M6281 Muscle weakness (generalized): Secondary | ICD-10-CM | POA: Diagnosis not present

## 2023-09-18 DIAGNOSIS — M6281 Muscle weakness (generalized): Secondary | ICD-10-CM | POA: Diagnosis not present

## 2023-09-18 DIAGNOSIS — R293 Abnormal posture: Secondary | ICD-10-CM | POA: Diagnosis not present

## 2023-09-18 DIAGNOSIS — M5459 Other low back pain: Secondary | ICD-10-CM | POA: Diagnosis not present
